# Patient Record
Sex: Male | Born: 1970 | Race: White | Hispanic: No | Marital: Married | Smoking: Never smoker
Health system: Southern US, Community
[De-identification: ages and names within clinical notes are randomized; demographics above are authoritative.]

## PROBLEM LIST (undated history)

## (undated) DIAGNOSIS — K219 Gastro-esophageal reflux disease without esophagitis: Secondary | ICD-10-CM

## (undated) DIAGNOSIS — S43014A Anterior dislocation of right humerus, initial encounter: Secondary | ICD-10-CM

## (undated) DIAGNOSIS — Z789 Other specified health status: Secondary | ICD-10-CM

## (undated) HISTORY — PX: WISDOM TOOTH EXTRACTION: SHX21

---

## 2010-05-16 ENCOUNTER — Encounter
Admission: RE | Admit: 2010-05-16 | Discharge: 2010-05-16 | Payer: Self-pay | Source: Home / Self Care | Attending: Internal Medicine | Admitting: Internal Medicine

## 2014-05-30 ENCOUNTER — Other Ambulatory Visit: Payer: Self-pay | Admitting: Orthopedic Surgery

## 2014-06-25 ENCOUNTER — Encounter (HOSPITAL_BASED_OUTPATIENT_CLINIC_OR_DEPARTMENT_OTHER): Payer: Self-pay | Admitting: *Deleted

## 2014-06-25 NOTE — Progress Notes (Signed)
Pt ER physician [place] -no labs needed

## 2014-06-29 ENCOUNTER — Ambulatory Visit (HOSPITAL_BASED_OUTPATIENT_CLINIC_OR_DEPARTMENT_OTHER): Payer: 59 | Admitting: Anesthesiology

## 2014-06-29 ENCOUNTER — Encounter (HOSPITAL_BASED_OUTPATIENT_CLINIC_OR_DEPARTMENT_OTHER): Payer: Self-pay | Admitting: *Deleted

## 2014-06-29 ENCOUNTER — Encounter (HOSPITAL_BASED_OUTPATIENT_CLINIC_OR_DEPARTMENT_OTHER)
Admission: RE | Disposition: A | Discharge status: Home / Self Care | Payer: Self-pay | Source: Ambulatory Visit | Attending: Orthopedic Surgery

## 2014-06-29 ENCOUNTER — Ambulatory Visit (HOSPITAL_BASED_OUTPATIENT_CLINIC_OR_DEPARTMENT_OTHER)
Admission: RE | Admit: 2014-06-29 | Discharge: 2014-06-29 | Disposition: A | Discharge status: Home / Self Care | Payer: 59 | Source: Ambulatory Visit | Attending: Orthopedic Surgery | Admitting: Orthopedic Surgery

## 2014-06-29 DIAGNOSIS — W19XXXA Unspecified fall, initial encounter: Secondary | ICD-10-CM | POA: Insufficient documentation

## 2014-06-29 DIAGNOSIS — S43004A Unspecified dislocation of right shoulder joint, initial encounter: Secondary | ICD-10-CM | POA: Diagnosis present

## 2014-06-29 DIAGNOSIS — K219 Gastro-esophageal reflux disease without esophagitis: Secondary | ICD-10-CM | POA: Diagnosis not present

## 2014-06-29 DIAGNOSIS — Y92098 Other place in other non-institutional residence as the place of occurrence of the external cause: Secondary | ICD-10-CM | POA: Diagnosis not present

## 2014-06-29 DIAGNOSIS — Z79899 Other long term (current) drug therapy: Secondary | ICD-10-CM | POA: Insufficient documentation

## 2014-06-29 DIAGNOSIS — M25311 Other instability, right shoulder: Secondary | ICD-10-CM | POA: Insufficient documentation

## 2014-06-29 DIAGNOSIS — M7501 Adhesive capsulitis of right shoulder: Secondary | ICD-10-CM | POA: Diagnosis not present

## 2014-06-29 DIAGNOSIS — Y939 Activity, unspecified: Secondary | ICD-10-CM | POA: Insufficient documentation

## 2014-06-29 DIAGNOSIS — Y999 Unspecified external cause status: Secondary | ICD-10-CM | POA: Insufficient documentation

## 2014-06-29 DIAGNOSIS — S43084A Other dislocation of right shoulder joint, initial encounter: Secondary | ICD-10-CM | POA: Insufficient documentation

## 2014-06-29 DIAGNOSIS — S43014A Anterior dislocation of right humerus, initial encounter: Secondary | ICD-10-CM | POA: Diagnosis present

## 2014-06-29 DIAGNOSIS — S43431A Superior glenoid labrum lesion of right shoulder, initial encounter: Secondary | ICD-10-CM | POA: Diagnosis not present

## 2014-06-29 HISTORY — DX: Other specified health status: Z78.9

## 2014-06-29 HISTORY — DX: Anterior dislocation of right humerus, initial encounter: S43.014A

## 2014-06-29 HISTORY — DX: Gastro-esophageal reflux disease without esophagitis: K21.9

## 2014-06-29 SURGERY — SHOULDER ATHROSCOPY WITH CAPSULORRHAPHY
Anesthesia: Regional | Site: Shoulder | Laterality: Right

## 2014-06-29 MED ORDER — BUPIVACAINE-EPINEPHRINE (PF) 0.5% -1:200000 IJ SOLN
INTRAMUSCULAR | Status: DC | PRN
  Administered 2014-06-29: 30 mL via PERINEURAL

## 2014-06-29 MED ORDER — FENTANYL CITRATE 0.05 MG/ML IJ SOLN
INTRAMUSCULAR | Status: AC
  Filled 2014-06-29: qty 2

## 2014-06-29 MED ORDER — CEFAZOLIN SODIUM-DEXTROSE 2-3 GM-% IV SOLR
INTRAVENOUS | Status: AC
  Filled 2014-06-29: qty 50

## 2014-06-29 MED ORDER — SUCCINYLCHOLINE CHLORIDE 20 MG/ML IJ SOLN
INTRAMUSCULAR | Status: DC | PRN
  Administered 2014-06-29: 100 mg via INTRAVENOUS

## 2014-06-29 MED ORDER — LIDOCAINE HCL (CARDIAC) 20 MG/ML IV SOLN
INTRAVENOUS | Status: DC | PRN
  Administered 2014-06-29: 60 mg via INTRAVENOUS

## 2014-06-29 MED ORDER — SODIUM CHLORIDE 0.9 % IR SOLN
Status: DC | PRN
  Administered 2014-06-29: 6000 mL

## 2014-06-29 MED ORDER — FENTANYL CITRATE 0.05 MG/ML IJ SOLN
50.0000 ug | INTRAMUSCULAR | Status: DC | PRN
  Administered 2014-06-29: 100 ug via INTRAVENOUS

## 2014-06-29 MED ORDER — LACTATED RINGERS IV SOLN
INTRAVENOUS | Status: DC
  Administered 2014-06-29: 06:00:00 via INTRAVENOUS

## 2014-06-29 MED ORDER — MIDAZOLAM HCL 2 MG/2ML IJ SOLN
INTRAMUSCULAR | Status: AC
  Filled 2014-06-29: qty 2

## 2014-06-29 MED ORDER — HYDROMORPHONE HCL 1 MG/ML IJ SOLN: 0.2500 mg | INTRAMUSCULAR | Status: DC | PRN

## 2014-06-29 MED ORDER — DEXAMETHASONE SODIUM PHOSPHATE 4 MG/ML IJ SOLN
INTRAMUSCULAR | Status: DC | PRN
  Administered 2014-06-29: 10 mg via INTRAVENOUS

## 2014-06-29 MED ORDER — OXYCODONE-ACETAMINOPHEN 5-325 MG PO TABS: 1.0000 | ORAL_TABLET | Freq: Four times a day (QID) | ORAL | Status: AC | PRN

## 2014-06-29 MED ORDER — BACLOFEN 10 MG PO TABS: 10.0000 mg | ORAL_TABLET | Freq: Three times a day (TID) | ORAL | Status: AC

## 2014-06-29 MED ORDER — PROPOFOL 10 MG/ML IV BOLUS
INTRAVENOUS | Status: DC | PRN
  Administered 2014-06-29: 200 mg via INTRAVENOUS

## 2014-06-29 MED ORDER — OXYCODONE HCL 5 MG PO TABS: 5.0000 mg | ORAL_TABLET | Freq: Once | ORAL | Status: DC | PRN

## 2014-06-29 MED ORDER — SENNA-DOCUSATE SODIUM 8.6-50 MG PO TABS: 2.0000 | ORAL_TABLET | Freq: Every day | ORAL | Status: AC

## 2014-06-29 MED ORDER — OXYCODONE HCL 5 MG/5ML PO SOLN: 5.0000 mg | Freq: Once | ORAL | Status: DC | PRN

## 2014-06-29 MED ORDER — ONDANSETRON HCL 4 MG PO TABS: 4.0000 mg | ORAL_TABLET | Freq: Three times a day (TID) | ORAL | Status: AC | PRN

## 2014-06-29 MED ORDER — ONDANSETRON HCL 4 MG/2ML IJ SOLN: 4.0000 mg | Freq: Four times a day (QID) | INTRAMUSCULAR | Status: DC | PRN

## 2014-06-29 MED ORDER — FENTANYL CITRATE 0.05 MG/ML IJ SOLN
INTRAMUSCULAR | Status: DC | PRN
  Administered 2014-06-29: 50 ug via INTRAVENOUS

## 2014-06-29 MED ORDER — CEFAZOLIN SODIUM-DEXTROSE 2-3 GM-% IV SOLR: 2.0000 g | INTRAVENOUS | Status: DC

## 2014-06-29 MED ORDER — ONDANSETRON HCL 4 MG/2ML IJ SOLN
INTRAMUSCULAR | Status: DC | PRN
  Administered 2014-06-29: 4 mg via INTRAVENOUS

## 2014-06-29 MED ORDER — MIDAZOLAM HCL 2 MG/2ML IJ SOLN
1.0000 mg | INTRAMUSCULAR | Status: DC | PRN
  Administered 2014-06-29: 2 mg via INTRAVENOUS

## 2014-06-29 MED ORDER — DEXTROSE 5 % IV SOLN
3.0000 g | INTRAVENOUS | Status: AC
  Administered 2014-06-29: 2 g via INTRAVENOUS

## 2014-06-29 SURGICAL SUPPLY — 69 items
ANCHOR SUT BIOCOMP LK 2.9X12.5 (Anchor) ×6 IMPLANT
BLADE CUTTER GATOR 3.5 (BLADE) ×3 IMPLANT
BLADE GREAT WHITE 4.2 (BLADE) IMPLANT
BLADE GREAT WHITE 4.2MM (BLADE)
BLADE SURG 15 STRL LF DISP TIS (BLADE) IMPLANT
BLADE SURG 15 STRL SS (BLADE)
BUR OVAL 6.0 (BURR) IMPLANT
CANNULA 5.75X71 LONG (CANNULA) ×3 IMPLANT
CANNULA TWIST IN 8.25X7CM (CANNULA) ×3 IMPLANT
CANNULA TWIST IN 8.25X9CM (CANNULA) IMPLANT
CLOSURE STERI-STRIP 1/2X4 (GAUZE/BANDAGES/DRESSINGS) ×1
CLSR STERI-STRIP ANTIMIC 1/2X4 (GAUZE/BANDAGES/DRESSINGS) ×2 IMPLANT
DECANTER SPIKE VIAL GLASS SM (MISCELLANEOUS) IMPLANT
DRAPE INCISE IOBAN 66X45 STRL (DRAPES) ×3 IMPLANT
DRAPE SHOULDER BEACH CHAIR (DRAPES) ×3 IMPLANT
DRAPE U 20/CS (DRAPES) ×3 IMPLANT
DRAPE U-SHAPE 47X51 STRL (DRAPES) ×3 IMPLANT
DRSG PAD ABDOMINAL 8X10 ST (GAUZE/BANDAGES/DRESSINGS) ×3 IMPLANT
DURAPREP 26ML APPLICATOR (WOUND CARE) ×3 IMPLANT
ELECT REM PT RETURN 9FT ADLT (ELECTROSURGICAL)
ELECTRODE REM PT RTRN 9FT ADLT (ELECTROSURGICAL) IMPLANT
FIBERSTICK 2 (SUTURE) IMPLANT
GAUZE SPONGE 4X4 12PLY STRL (GAUZE/BANDAGES/DRESSINGS) ×3 IMPLANT
GLOVE BIO SURGEON STRL SZ8 (GLOVE) ×3 IMPLANT
GLOVE BIOGEL PI IND STRL 7.0 (GLOVE) ×2 IMPLANT
GLOVE BIOGEL PI IND STRL 8 (GLOVE) ×2 IMPLANT
GLOVE BIOGEL PI INDICATOR 7.0 (GLOVE) ×4
GLOVE BIOGEL PI INDICATOR 8 (GLOVE) ×4
GLOVE ECLIPSE 6.5 STRL STRAW (GLOVE) ×3 IMPLANT
GLOVE ORTHO TXT STRL SZ7.5 (GLOVE) ×3 IMPLANT
GOWN STRL REUS W/ TWL LRG LVL3 (GOWN DISPOSABLE) ×1 IMPLANT
GOWN STRL REUS W/ TWL XL LVL3 (GOWN DISPOSABLE) ×2 IMPLANT
GOWN STRL REUS W/TWL LRG LVL3 (GOWN DISPOSABLE) ×2
GOWN STRL REUS W/TWL XL LVL3 (GOWN DISPOSABLE) ×4
IMMOBILIZER SHOULDER FOAM XLGE (SOFTGOODS) ×3 IMPLANT
IV NS IRRIG 3000ML ARTHROMATIC (IV SOLUTION) ×9 IMPLANT
KIT DISPOSABLE PUSHLOCK 2.9MM (KITS) ×3 IMPLANT
KIT SHOULDER TRACTION (DRAPES) ×3 IMPLANT
LASSO 90 CVE QUICKPAS (DISPOSABLE) ×3 IMPLANT
MANIFOLD NEPTUNE II (INSTRUMENTS) ×3 IMPLANT
NEEDLE SCORPION MULTI FIRE (NEEDLE) IMPLANT
PACK ARTHROSCOPY DSU (CUSTOM PROCEDURE TRAY) ×3 IMPLANT
PACK BASIN DAY SURGERY FS (CUSTOM PROCEDURE TRAY) ×3 IMPLANT
SET ARTHROSCOPY TUBING (MISCELLANEOUS) ×2
SET ARTHROSCOPY TUBING LN (MISCELLANEOUS) ×1 IMPLANT
SHEET MEDIUM DRAPE 40X70 STRL (DRAPES) ×3 IMPLANT
SLEEVE SCD COMPRESS KNEE MED (MISCELLANEOUS) ×3 IMPLANT
SLING ARM IMMOBILIZER LRG (SOFTGOODS) IMPLANT
SLING ARM IMMOBILIZER MED (SOFTGOODS) IMPLANT
SLING ARM LRG ADULT FOAM STRAP (SOFTGOODS) IMPLANT
SLING ARM MED ADULT FOAM STRAP (SOFTGOODS) IMPLANT
SLING ARM XL FOAM STRAP (SOFTGOODS) IMPLANT
SUT FIBERWIRE #2 38 T-5 BLUE (SUTURE)
SUT MNCRL AB 4-0 PS2 18 (SUTURE) ×3 IMPLANT
SUT PDS AB 1 CT  36 (SUTURE)
SUT PDS AB 1 CT 36 (SUTURE) IMPLANT
SUT TIGER TAPE 7 IN WHITE (SUTURE) IMPLANT
SUT VIC AB 3-0 SH 27 (SUTURE)
SUT VIC AB 3-0 SH 27X BRD (SUTURE) IMPLANT
SUTURE FIBERWR #2 38 T-5 BLUE (SUTURE) IMPLANT
TAPE FIBER 2MM 7IN #2 BLUE (SUTURE) IMPLANT
TAPE LABRALWHITE 1.5X36 (TAPE) ×3 IMPLANT
TAPE SUT LABRALTAP WHT/BLK (SUTURE) ×3 IMPLANT
TOWEL OR 17X24 6PK STRL BLUE (TOWEL DISPOSABLE) ×3 IMPLANT
TOWEL OR NON WOVEN STRL DISP B (DISPOSABLE) ×3 IMPLANT
TUBE CONNECTING 20'X1/4 (TUBING)
TUBE CONNECTING 20X1/4 (TUBING) IMPLANT
WAND STAR VAC 90 (SURGICAL WAND) ×3 IMPLANT
WATER STERILE IRR 1000ML POUR (IV SOLUTION) ×3 IMPLANT

## 2014-06-29 NOTE — Discharge Instructions (Signed)
Diet: As you were doing prior to hospitalization  ° °Shower:  May shower but keep the wounds dry, use an occlusive plastic wrap, NO SOAKING IN TUB.  If the bandage gets wet, change with a clean dry gauze. ° °Dressing:  You may change your dressing 3-5 days after surgery.  Then change the dressing daily with sterile gauze dressing.   ° °There are sticky tapes (steri-strips) on your wounds and all the stitches are absorbable.  Leave the steri-strips in place when changing your dressings, they will peel off with time, usually 2-3 weeks. ° °Activity:  Increase activity slowly as tolerated, but follow the weight bearing instructions below.  No lifting or driving for 6 weeks. ° °Weight Bearing:   Sling at all times.   ° °To prevent constipation: you may use a stool softener such as - ° °Colace (over the counter) 100 mg by mouth twice a day  °Drink plenty of fluids (prune juice may be helpful) and high fiber foods °Miralax (over the counter) for constipation as needed.   ° °Itching:  If you experience itching with your medications, try taking only a single pain pill, or even half a pain pill at a time.  You may take up to 10 pain pills per day, and you can also use benadryl over the counter for itching or also to help with sleep.  ° °Precautions:  If you experience chest pain or shortness of breath - call 911 immediately for transfer to the hospital emergency department!! ° °If you develop a fever greater that 101 F, purulent drainage from wound, increased redness or drainage from wound, or calf pain -- Call the office at [phone]                                                °Follow- Up Appointment:  Please call for an appointment to be seen in 2 weeks [place] - [phone] ° ° ° ° °Regional Anesthesia Blocks ° °1. Numbness or the inability to move the "blocked" extremity may last from 3-48 hours after placement. The length of time depends on the medication injected and your individual response to the  medication. If the numbness is not going away after 48 hours, call your surgeon. ° °2. The extremity that is blocked will need to be protected until the numbness is gone and the  Strength has returned. Because you cannot feel it, you will need to take extra care to avoid injury. Because it may be weak, you may have difficulty moving it or using it. You may not know what position it is in without looking at it while the block is in effect. ° °3. For blocks in the legs and feet, returning to weight bearing and walking needs to be done carefully. You will need to wait until the numbness is entirely gone and the strength has returned. You should be able to move your leg and foot normally before you try and bear weight or walk. You will need someone to be with you when you first try to ensure you do not fall and possibly risk injury. ° °4. Bruising and tenderness at the needle site are common side effects and will resolve in a few days. ° °5. Persistent numbness or new problems with movement should be communicated to the surgeon or the [facility] ([phone])/ [facility] ([phone]). ° ° ° °Post Anesthesia Home Care Instructions ° °Activity: °Get plenty of rest for the remainder of the day. A responsible adult should stay with you for 24 hours following the procedure.  °For the next 24 hours, DO NOT: °-Drive a car °-Operate machinery °-Drink alcoholic beverages °-Take any medication unless instructed by your physician °-Make any legal decisions or sign important papers. ° °Meals: °Start with liquid foods such as gelatin or soup. Progress to regular foods as tolerated. Avoid greasy, spicy, heavy foods. If nausea and/or vomiting occur, drink only clear liquids until the nausea and/or vomiting subsides. Call your physician if vomiting continues. ° °Special Instructions/Symptoms: °Your throat may feel dry or sore from the anesthesia or the breathing tube placed in your throat during surgery.  If this causes discomfort, gargle with warm salt water. The discomfort should disappear within 24 hours. ° °

## 2014-06-29 NOTE — Anesthesia Postprocedure Evaluation (Signed)
Anesthesia Post Note  Patient: Manuel ClineKevin Mclaughlin  Procedure(s) Performed: Procedure(s) (LRB): SHOULDER ATHROSCOPY WITH CAPSULORRHAPHY  (Right)  Anesthesia type: General  Patient location: PACU  Post pain: Pain level controlled and Adequate analgesia  Post assessment: Post-op Vital signs reviewed, Patient's Cardiovascular Status Stable, Respiratory Function Stable, Patent Airway and Pain level controlled  Last Vitals:  Filed Vitals:   06/29/14 1025  BP: 155/100  Pulse: 78  Temp:   Resp: 18    Post vital signs: Reviewed and stable  Level of consciousness: awake, alert  and oriented  Complications: No apparent anesthesia complications

## 2014-06-29 NOTE — H&P (Signed)
PREOPERATIVE H&Mclaughlin  Chief Complaint: articular disorders, right shoulder, unspecified shoulder joint  HPI: Manuel Mclaughlin is a 44 y.o. male who presents for preoperative history and physical with a diagnosis of articular disorders, right shoulder, unspecified shoulder joint. Symptoms are rated as moderate to severe, and have been worsening.  This is significantly impairing activities of daily living.  He has elected for surgical management. This happened after falling at a bouncy house, with a dislocation of the right shoulder, that he was able to self reduce.  Ongoing symptoms of instability and apprehension.  Past Medical History  Diagnosis Date  . Medical history non-contributory   . GERD (gastroesophageal reflux disease)    Past Surgical History  Procedure Laterality Date  . Wisdom tooth extraction     History   Social History  . Marital Status: Married    Spouse Name: N/A  . Number of Children: N/A  . Years of Education: N/A   Social History Main Topics  . Smoking status: Never Smoker   . Smokeless tobacco: Not on file  . Alcohol Use: Yes     Comment: occ  . Drug Use: No  . Sexual Activity: Not on file   Other Topics Concern  . None   Social History Narrative   History reviewed. No pertinent family history. Allergies  Allergen Reactions  . Sulfa Antibiotics Rash   Prior to Admission medications   Medication Sig Start Date End Date Taking? Authorizing Provider  pantoprazole (PROTONIX) 40 MG tablet Take 40 mg by mouth daily.   Yes Historical Provider, MD     Positive ROS: All other systems have been reviewed and were otherwise negative with the exception of those mentioned in the HPI and as above.  Physical Exam: General: Alert, no acute distress Cardiovascular: No pedal edema Respiratory: No cyanosis, no use of accessory musculature GI: No organomegaly, abdomen is soft and non-tender Skin: No lesions in the area of chief complaint Neurologic: Sensation intact  distally Psychiatric: Patient is competent for consent with normal mood and affect Lymphatic: No axillary or cervical lymphadenopathy  MUSCULOSKELETAL: right shoulder AROM 0-170.  Intact cuff strength, positive apprehension.  Assessment: Right shoulder glenohumeral dislocation with anterior labral tear  Plan: Plan for Procedure(s): SHOULDER ATHROSCOPY WITH CAPSULORRHAPHY And labral repair  The risks benefits and alternatives were discussed with the patient including but not limited to the risks of nonoperative treatment, versus surgical intervention including infection, bleeding, nerve injury,  blood clots, cardiopulmonary complications, morbidity, mortality, among others, and they were willing to proceed. We also discussed the risks for recurrent instability, incomplete relief symptoms, stiffness, among others.  Manuel Mclaughlin,Manuel Lyssy P, MD Cell [phone]   06/29/2014 7:14 AM

## 2014-06-29 NOTE — Anesthesia Preprocedure Evaluation (Signed)
Anesthesia Evaluation  Patient identified by MRN, date of birth, ID band Patient awake    Reviewed: Allergy & Precautions, NPO status , Patient's Chart, lab work & pertinent test results  Airway Mallampati: II   Neck ROM: full    Dental   Pulmonary neg pulmonary ROS,  breath sounds clear to auscultation        Cardiovascular negative cardio ROS  Rhythm:regular Rate:Normal     Neuro/Psych    GI/Hepatic GERD-  ,  Endo/Other    Renal/GU      Musculoskeletal   Abdominal   Peds  Hematology   Anesthesia Other Findings   Reproductive/Obstetrics                             Anesthesia Physical Anesthesia Plan  ASA: II  Anesthesia Plan: General and Regional   Post-op Pain Management: MAC Combined w/ Regional for Post-op pain   Induction: Intravenous  Airway Management Planned: Oral ETT  Additional Equipment:   Intra-op Plan:   Post-operative Plan: Extubation in OR  Informed Consent: I have reviewed the patients History and Physical, chart, labs and discussed the procedure including the risks, benefits and alternatives for the proposed anesthesia with the patient or authorized representative who has indicated his/her understanding and acceptance.     Plan Discussed with: CRNA, Anesthesiologist and Surgeon  Anesthesia Plan Comments:         Anesthesia Quick Evaluation

## 2014-06-29 NOTE — Progress Notes (Signed)
Assisted Dr. Hodierne with right, ultrasound guided, interscalene  block. Side rails up, monitors on throughout procedure. See vital signs in flow sheet. Tolerated Procedure well. 

## 2014-06-29 NOTE — Anesthesia Procedure Notes (Addendum)
Anesthesia Regional Block:  Interscalene brachial plexus block  Pre-Anesthetic Checklist: ,, timeout performed, Correct Patient, Correct Site, Correct Laterality, Correct Procedure, Correct Position, site marked, Risks and benefits discussed,  Surgical consent,  Pre-op evaluation,  At surgeon's request and post-op pain management  Laterality: Right  Prep: chloraprep       Needles:  Injection technique: Single-shot  Needle Type: Echogenic Stimulator Needle     Needle Length: 5cm 5 cm Needle Gauge: 22 and 22 G    Additional Needles:  Procedures: ultrasound guided (picture in chart) and nerve stimulator Interscalene brachial plexus block  Nerve Stimulator or Paresthesia:  Response: biceps flexion, 0.45 mA,   Additional Responses:   Narrative:  Start time: 06/29/2014 7:04 AM End time: 06/29/2014 7:14 AM Injection made incrementally with aspirations every 5 mL.  Performed by: Personally  Anesthesiologist: HODIERNE, ADAM  Additional Notes: Functioning IV was confirmed and monitors were applied.  A 50mm 22ga Arrow echogenic stimulator needle was used. Sterile prep and drape,hand hygiene and sterile gloves were used.  Negative aspiration and negative test dose prior to incremental administration of local anesthetic. The patient tolerated the procedure well.  Ultrasound guidance: relevent anatomy identified, needle position confirmed, local anesthetic spread visualized around nerve(s), vascular puncture avoided.  Image printed for medical record.    Procedure Name: Intubation Performed by: York GricePEARSON, Chrystine Frogge W Pre-anesthesia Checklist: Patient identified, Timeout performed, Emergency Drugs available, Suction available and Patient being monitored Patient Re-evaluated:Patient Re-evaluated prior to inductionOxygen Delivery Method: Circle system utilized Preoxygenation: Pre-oxygenation with 100% oxygen Intubation Type: IV induction Ventilation: Mask ventilation without  difficulty Laryngoscope Size: Miller and 2 Grade View: Grade II Tube type: Oral Number of attempts: 1 Airway Equipment and Method: Stylet Placement Confirmation: ETT inserted through vocal cords under direct vision,  breath sounds checked- equal and bilateral and positive ETCO2 Secured at: 22 cm Tube secured with: Tape Dental Injury: Teeth and Oropharynx as per pre-operative assessment

## 2014-06-29 NOTE — Op Note (Signed)
06/29/2014  8:40 AM  PATIENT:  Manuel Mclaughlin    PRE-OPERATIVE DIAGNOSIS:  Right shoulder anterior dislocation with symptomatic instability  POST-OPERATIVE DIAGNOSIS:  Right shoulder anterior dislocation of symptomatic instability and a element of adhesive capsulitis  PROCEDURE:  Right shoulder arthroscopy with capsulorrhaphy and Bankart repair with manipulation under anesthesia  SURGEON:  Eulas PostLANDAU,Lyncoln Maskell P, MD  PHYSICIAN ASSISTANT: Janace LittenBrandon Parry, OPA-C, present and scrubbed throughout the case, critical for completion in a timely fashion, and for retraction, instrumentation, and closure.  ANESTHESIA:   General  PREOPERATIVE INDICATIONS:  Manuel ClineKevin Woollard is a  44 y.o. male Or dislocated his right shoulder and complained of persistent symptoms of instability as well as pain and elected for surgical management.   The risks benefits and alternatives were discussed with the patient preoperatively including but not limited to the risks of infection, bleeding, nerve injury, cardiopulmonary complications, the need for revision surgery, among others, and the patient was willing to proceed.  OPERATIVE IMPLANTS: Ar[employer] bio composite 2.9 mm short push lock anchors x2. I used a total of 2 #2 labral fiber tapes in an inverted horizontal mattress configuration With the superio tape being in aPatient operating roomin af simple configuration..  OPERATIVE FINDINGS: Shoulder actually had a fair amount of stiffness during examination under anesthesia, and manipulation under anesthesia yielded significant lysis of adhesions. This was actually done after the glenohumeral exam, and the exam under the glenohumeral joint demonstrated anterior instability clinically. I was able to dislocate him anteriorly, and then reduce him back in the joint. It is quite an interesting constellation of findings, with him having developed stiffness in the forward flexion plane, although unstable anteriorly. The rotator cuff was completely  intact, biceps tendon and superior labrum totally intact as well as the posterior labrum. The anterior labrum was stripped off of the bone, with chondral type damage, and there was evidence for fibrocartilage that her artery started healing in from the Labral avulsion.  OPERATIVE PROCEDURE: The patient was brought to the operating room and placed in the supine position. General anesthesia was administered. IV antibiotics were given. General anesthesia was administered.   The upper extremity was examined and found to be grossly unstable particularly to anterior testing, Although as indicated above it was stiff with forward flexion and manipulation yielded release. The upper extremity was prepped and draped in the usual sterile fashion. The patient was in a semilateral decubitus position.  Time out was performed. Diagnostic arthroscopy was carried out the above-named findings.   I placed 2 anterior cannulas, and then mobilized the labrum off of the medial neck of the glenoid with the spatula.  I then prepared the neck of the glenoid with a shaver/rasp to optimize healing, while still preserving the anterior bone stock.  The labrum had excellent mobility. There was probably about 10% bone loss anteriorly.  I then used a suture passer to pass an inverted labral fiber tape on either side of the inferior anterior glenohumeral ligament. This had excellent purchase on the tissue.  I anchored the anterior inferior glenohumeral ligament into the glenoid using a push lock anchor.   I then placed a second suture slightly superiorly.  This was anchored above the 3:00 position using a push lock.   Excellent soft tissue restoration of tension was achieved, and the arthroscopic cannulas were removed, and the portals closed with Monocryl followed by Steri-Strips and sterile gauze. The patient was awakened and returned to the PACU in stable and satisfactory condition. There were no complications and  the patient tolerated  the procedure well.

## 2014-06-29 NOTE — Transfer of Care (Signed)
Immediate Anesthesia Transfer of Care Note  Patient: Manuel ClineKevin Mclaughlin  Procedure(s) Performed: Procedure(s): SHOULDER ATHROSCOPY WITH CAPSULORRHAPHY  (Right)  Patient Location: PACU  Anesthesia Type:General  Level of Consciousness: awake and sedated  Airway & Oxygen Therapy: Patient Spontanous Breathing and Patient connected to face mask oxygen  Post-op Assessment: Report given to RN and Post -op Vital signs reviewed and stable  Post vital signs: Reviewed and stable  Last Vitals:  Filed Vitals:   06/29/14 0852  BP:   Pulse: 100  Temp: 36.5 C  Resp: 19    Complications: No apparent anesthesia complications

## 2014-07-02 LAB — POCT HEMOGLOBIN-HEMACUE: Hemoglobin: 15.1 g/dL (ref 13.0–17.0)

## 2020-09-06 ENCOUNTER — Other Ambulatory Visit: Payer: Self-pay | Admitting: Family Medicine

## 2020-09-06 DIAGNOSIS — M5116 Intervertebral disc disorders with radiculopathy, lumbar region: Secondary | ICD-10-CM

## 2020-09-13 ENCOUNTER — Other Ambulatory Visit: Payer: Self-pay

## 2020-09-13 ENCOUNTER — Ambulatory Visit
Admission: RE | Admit: 2020-09-13 | Discharge: 2020-09-13 | Disposition: A | Discharge status: Home / Self Care | Payer: PRIVATE HEALTH INSURANCE | Source: Ambulatory Visit | Attending: Family Medicine | Admitting: Family Medicine

## 2020-09-13 DIAGNOSIS — M5116 Intervertebral disc disorders with radiculopathy, lumbar region: Secondary | ICD-10-CM

## 2020-09-13 MED ORDER — METHYLPREDNISOLONE ACETATE 40 MG/ML INJ SUSP (RADIOLOG
80.0000 mg | Freq: Once | INTRAMUSCULAR | Status: AC
  Administered 2020-09-13: 80 mg via EPIDURAL

## 2020-09-13 MED ORDER — IOPAMIDOL (ISOVUE-M 200) INJECTION 41%
1.0000 mL | Freq: Once | INTRAMUSCULAR | Status: AC
  Administered 2020-09-13: 1 mL via EPIDURAL

## 2020-09-13 NOTE — Discharge Instructions (Signed)

## 2020-09-16 ENCOUNTER — Other Ambulatory Visit: Payer: 59

## 2020-11-12 ENCOUNTER — Other Ambulatory Visit: Payer: Self-pay | Admitting: Family Medicine

## 2020-11-12 ENCOUNTER — Other Ambulatory Visit: Payer: Self-pay | Admitting: Primary Care

## 2020-11-12 DIAGNOSIS — G8929 Other chronic pain: Secondary | ICD-10-CM

## 2020-11-12 DIAGNOSIS — M545 Low back pain, unspecified: Secondary | ICD-10-CM

## 2020-11-25 ENCOUNTER — Ambulatory Visit
Admission: RE | Admit: 2020-11-25 | Discharge: 2020-11-25 | Disposition: A | Discharge status: Home / Self Care | Payer: No Typology Code available for payment source | Source: Ambulatory Visit | Attending: Family Medicine | Admitting: Family Medicine

## 2020-11-25 ENCOUNTER — Other Ambulatory Visit: Payer: Self-pay

## 2020-11-25 DIAGNOSIS — G8929 Other chronic pain: Secondary | ICD-10-CM

## 2020-11-25 DIAGNOSIS — M545 Low back pain, unspecified: Secondary | ICD-10-CM

## 2020-11-25 NOTE — Discharge Instructions (Signed)

## 2022-05-11 IMAGING — XA Imaging study
2 series · 2 of 2 positions shown · non-contrast
Comparison: none

CLINICAL DATA: Displacement the L5-S1 lumbar disc. Left lower
extremity radiculopathy. Patient reports improvement after the
previous injection. Residual pain.

[Series 1: ortho standard · 1 of 1 slices shown (1 of 2)]
[im 1/1]
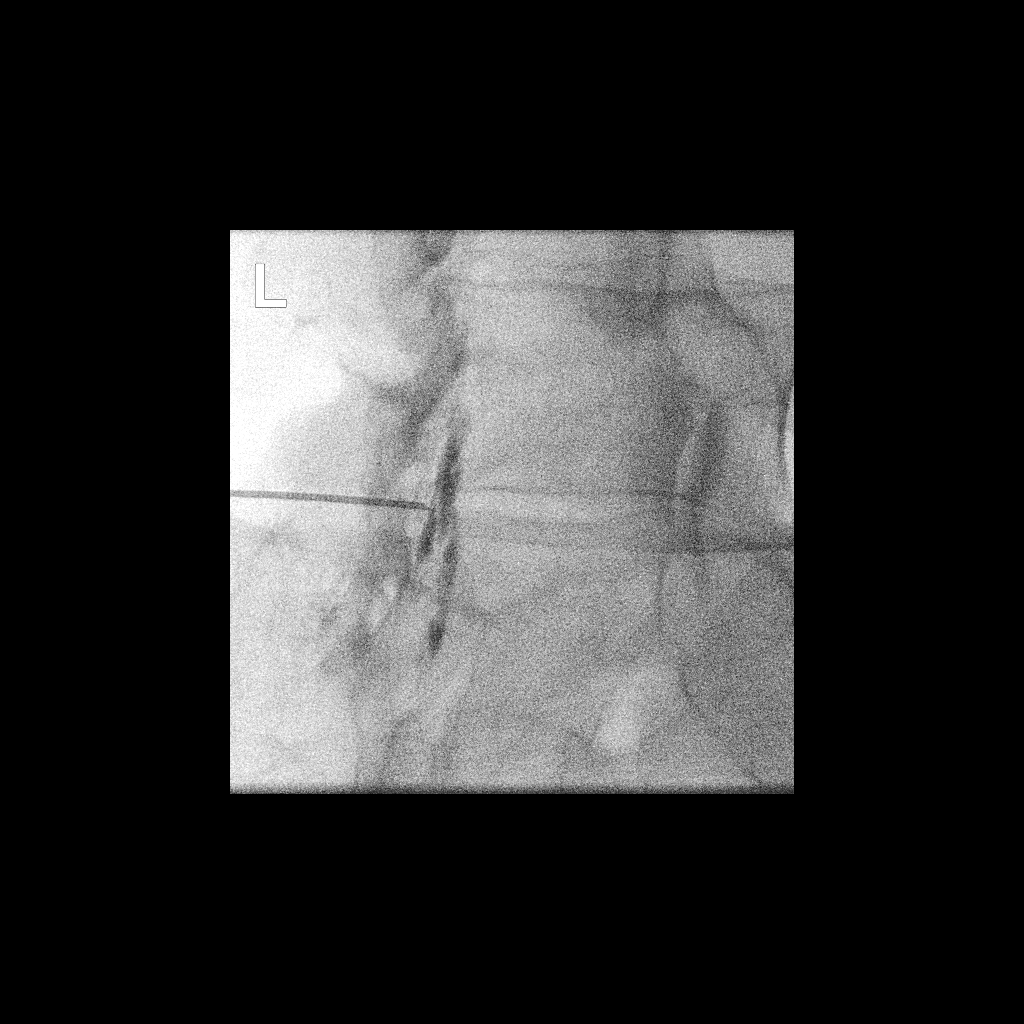

[Series 2: ortho standard · 1 of 1 slices shown (2 of 2)]
[im 1/1]
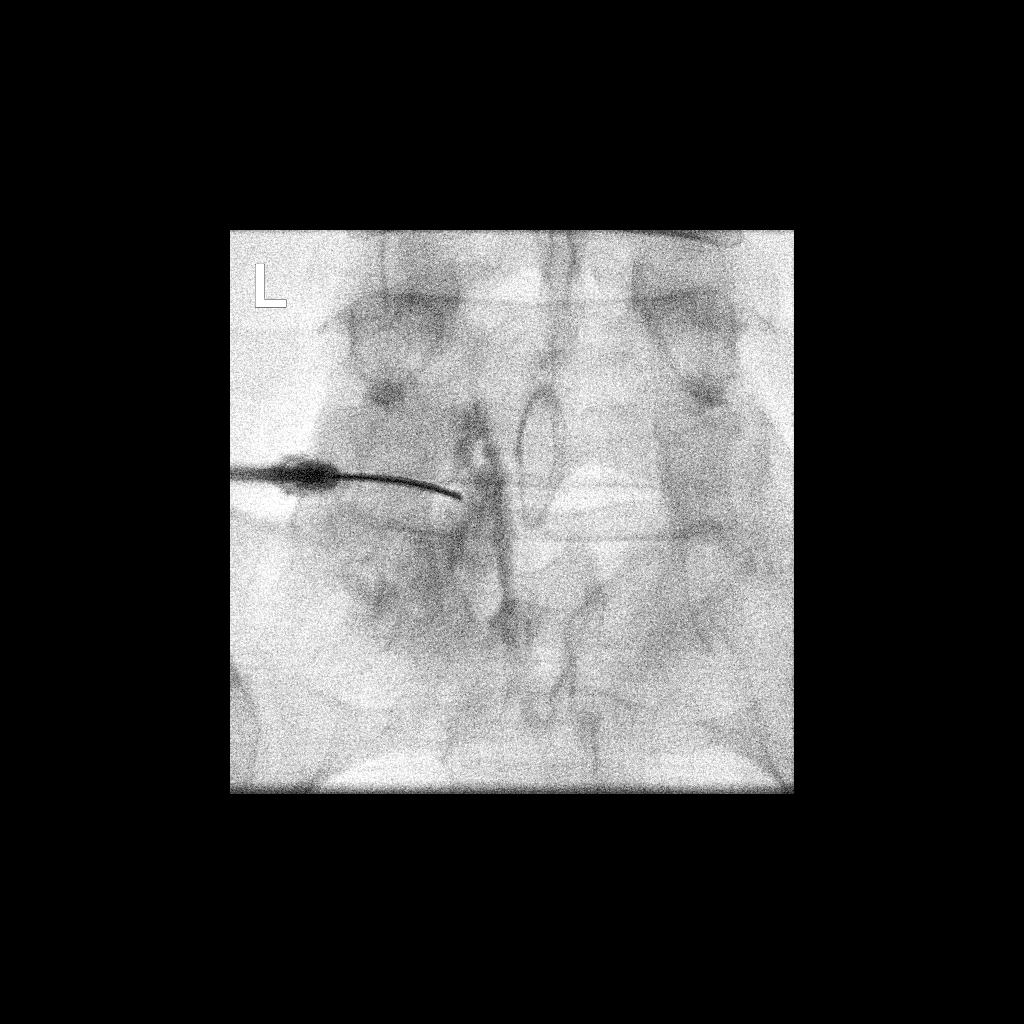

[2 of 2 positions shown; findings below may reference images not displayed]

FLUOROSCOPY TIME:  The radiation Exposure Index (as provided by the
fluoroscopic device): 9.29 uGy*m2

PROCEDURE:
The procedure, risks, benefits, and alternatives were explained to
the patient. Questions regarding the procedure were encouraged and
answered. The patient understands and consents to the procedure.

LUMBAR EPIDURAL INJECTION:

An interlaminar approach was performed on left at L5-S1. The
overlying skin was cleansed and anesthetized. A 20 gauge epidural
needle was advanced using loss-of-resistance technique.

DIAGNOSTIC EPIDURAL INJECTION:

Injection of Isovue-M 200 shows a good epidural pattern with spread
above and below the level of needle placement, primarily on the left
no vascular opacification is seen.

THERAPEUTIC EPIDURAL INJECTION:

80 mg of Depo-Medrol mixed with 1 mL 1% lidocaine were instilled.
The procedure was well-tolerated, and the patient was discharged
thirty minutes following the injection in good condition.

COMPLICATIONS:
None
IMPRESSION: Technically successful epidural injection on the left L5-S1 # 2
